# Patient Record
Sex: Male | Born: 1950 | Race: White | Hispanic: No | Smoking: Never smoker
Health system: Southern US, Community
[De-identification: ages and names within clinical notes are randomized; demographics above are authoritative.]

## PROBLEM LIST (undated history)

## (undated) DIAGNOSIS — I10 Essential (primary) hypertension: Secondary | ICD-10-CM

## (undated) HISTORY — PX: TONSILLECTOMY: SUR1361

---

## 2017-08-28 ENCOUNTER — Encounter (HOSPITAL_COMMUNITY): Payer: Self-pay | Admitting: Emergency Medicine

## 2017-08-28 ENCOUNTER — Emergency Department (HOSPITAL_COMMUNITY): Payer: No Typology Code available for payment source

## 2017-08-28 ENCOUNTER — Emergency Department (HOSPITAL_COMMUNITY)
Admission: EM | Admit: 2017-08-28 | Discharge: 2017-08-28 | Disposition: A | Discharge status: Home / Self Care | Payer: No Typology Code available for payment source | Attending: Emergency Medicine | Admitting: Emergency Medicine

## 2017-08-28 DIAGNOSIS — I10 Essential (primary) hypertension: Secondary | ICD-10-CM | POA: Diagnosis not present

## 2017-08-28 DIAGNOSIS — Y9389 Activity, other specified: Secondary | ICD-10-CM | POA: Insufficient documentation

## 2017-08-28 DIAGNOSIS — R52 Pain, unspecified: Secondary | ICD-10-CM

## 2017-08-28 DIAGNOSIS — S39012A Strain of muscle, fascia and tendon of lower back, initial encounter: Secondary | ICD-10-CM | POA: Insufficient documentation

## 2017-08-28 DIAGNOSIS — Y92410 Unspecified street and highway as the place of occurrence of the external cause: Secondary | ICD-10-CM | POA: Diagnosis not present

## 2017-08-28 DIAGNOSIS — Y999 Unspecified external cause status: Secondary | ICD-10-CM | POA: Insufficient documentation

## 2017-08-28 DIAGNOSIS — S3992XA Unspecified injury of lower back, initial encounter: Secondary | ICD-10-CM | POA: Diagnosis present

## 2017-08-28 HISTORY — DX: Essential (primary) hypertension: I10

## 2017-08-28 MED ORDER — GI COCKTAIL ~~LOC~~
30.0000 mL | Freq: Once | ORAL | Status: AC
Start: 2017-08-28 — End: 2017-08-28
  Administered 2017-08-28: 30 mL via ORAL
  Filled 2017-08-28: qty 30

## 2017-08-28 MED ORDER — HYDROCODONE-ACETAMINOPHEN 5-325 MG PO TABS
1.0000 | ORAL_TABLET | Freq: Once | ORAL | Status: AC
  Administered 2017-08-28: 1 via ORAL
  Filled 2017-08-28: qty 1

## 2017-08-28 MED ORDER — METHOCARBAMOL 500 MG PO TABS: 500.0000 mg | ORAL_TABLET | Freq: Two times a day (BID) | ORAL | 0 refills | Status: AC | PRN

## 2017-08-28 MED ORDER — HYDROCODONE-ACETAMINOPHEN 5-325 MG PO TABS: ORAL_TABLET | ORAL | 0 refills | Status: AC
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
## 2017-08-28 NOTE — ED Notes (Signed)
Pt reports heart burn.  Joni Reining PA made aware.

## 2017-08-28 NOTE — Discharge Instructions (Signed)
Please take ibuprofen 400mg (this is normally 2 over the counter pills) every 6 hours (take with food to minimze stomach irritation).  ° °Take robaxin and/or Vicodin for breakthrough pain, do not drink alcohol, drive, care for children or perfom other critical tasks while taking robaxin and/or Vicodin . ° °Please follow with your primary care doctor in the next 2 days for a check-up. They must obtain records for further management.  ° °Do not hesitate to return to the Emergency Department for any new, worsening or concerning symptoms.  ° °
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
## 2018-04-12 DEATH — deceased
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
